# Patient Record
Sex: Male | Born: 1995 | Race: White | Hispanic: No | Marital: Single
Health system: Southern US, Community
[De-identification: ages and names within clinical notes are randomized; demographics above are authoritative.]

---

## 2017-09-19 ENCOUNTER — Other Ambulatory Visit: Payer: Self-pay

## 2017-09-19 ENCOUNTER — Emergency Department
Admission: EM | Admit: 2017-09-19 | Discharge: 2017-09-19 | Disposition: A | Discharge status: Home / Self Care | Payer: BLUE CROSS/BLUE SHIELD | Attending: Emergency Medicine | Admitting: Emergency Medicine

## 2017-09-19 ENCOUNTER — Emergency Department: Payer: BLUE CROSS/BLUE SHIELD

## 2017-09-19 ENCOUNTER — Encounter: Payer: Self-pay | Admitting: Physician Assistant

## 2017-09-19 DIAGNOSIS — W01198A Fall on same level from slipping, tripping and stumbling with subsequent striking against other object, initial encounter: Secondary | ICD-10-CM | POA: Insufficient documentation

## 2017-09-19 DIAGNOSIS — F0781 Postconcussional syndrome: Secondary | ICD-10-CM | POA: Diagnosis not present

## 2017-09-19 DIAGNOSIS — Y999 Unspecified external cause status: Secondary | ICD-10-CM | POA: Insufficient documentation

## 2017-09-19 DIAGNOSIS — Y92480 Sidewalk as the place of occurrence of the external cause: Secondary | ICD-10-CM | POA: Insufficient documentation

## 2017-09-19 DIAGNOSIS — S01112A Laceration without foreign body of left eyelid and periocular area, initial encounter: Secondary | ICD-10-CM

## 2017-09-19 DIAGNOSIS — T510X1A Toxic effect of ethanol, accidental (unintentional), initial encounter: Secondary | ICD-10-CM | POA: Diagnosis not present

## 2017-09-19 DIAGNOSIS — S098XXA Other specified injuries of head, initial encounter: Secondary | ICD-10-CM | POA: Diagnosis present

## 2017-09-19 DIAGNOSIS — F10120 Alcohol abuse with intoxication, uncomplicated: Secondary | ICD-10-CM

## 2017-09-19 DIAGNOSIS — R51 Headache: Secondary | ICD-10-CM | POA: Insufficient documentation

## 2017-09-19 DIAGNOSIS — Y939 Activity, unspecified: Secondary | ICD-10-CM | POA: Diagnosis not present

## 2017-09-19 MED ORDER — METOCLOPRAMIDE HCL 10 MG PO TABS
10.0000 mg | ORAL_TABLET | Freq: Once | ORAL | Status: AC
  Administered 2017-09-19: 10 mg via ORAL
  Filled 2017-09-19: qty 1

## 2017-09-19 MED ORDER — ACETAMINOPHEN 325 MG PO TABS
650.0000 mg | ORAL_TABLET | Freq: Once | ORAL | Status: AC
  Administered 2017-09-19: 650 mg via ORAL
  Filled 2017-09-19: qty 2

## 2017-09-19 MED ORDER — ONDANSETRON 4 MG PO TBDP: 4.0000 mg | ORAL_TABLET | Freq: Three times a day (TID) | ORAL | 0 refills | Status: AC | PRN

## 2017-09-19 NOTE — ED Notes (Signed)
Pt verbalize d/c understanding and follow up. Pt in NAD, VSS, pt ambulatory with friend at discharge. Denies any further concerns regarding this visit

## 2017-09-19 NOTE — Discharge Instructions (Signed)
Your exam and CT scan are negative at this time. Your symptoms likely represent a combination of mild concussion and alcohol hangover effect. Be sure to hydrate well and eat some complex carbohydrates. The steri-strips on the wound will fall off in about a week. Take OTC Tylenol and Motrin as needed. Take the nausea medicine as needed. [facility]-up with [facility] or [facility] as needed.

## 2017-09-19 NOTE — ED Triage Notes (Signed)
Patient states he tripped yesterday at 5pm and sustained a laceration to left eyebrow. Patient denies LOC.

## 2017-09-19 NOTE — ED Provider Notes (Addendum)
[facility] Emergency Department Provider Note ____________________________________________  Time seen: 1523  I have reviewed the triage vital signs and the nursing notes.  HISTORY  Chief Complaint  Laceration  HPI Johnathan Mcdaniel is a 22 y.o. male presents to the ED accompanied by his friend for evaluation of a facial contusion and left brow laceration.  Patient describes the accident occurred yesterday evening between 5 and 6 PM.  He was apparently walking down the street and was inebriated, when he tripped and fell, hitting his left brow on the curb.  A family witnessed the fall and brought patient into the home and cleaned them out.  There was no reported loss of consciousness, nausea, vomiting, or visual disturbance related to the accident.  Patient was then taken by his friends back to his apartment.  He admits to drinking for the better part of the week and started on Thursday night.  He denies any other episodes of syncope or vomiting.  He presents today with a mild dull headache that he describes as feeling hung over.  He denies any visual disturbance, and denies any other injury at this time.  He denies any nose injury, dental injury, or chest pain.  He admits that he did not present himself for evaluation and management of his facial laceration due to him being intoxicated.  History reviewed. No pertinent past medical history.  There are no active problems to display for this patient.  History reviewed. No pertinent surgical history.  Prior to Admission medications   Medication Sig Start Date End Date Taking? Authorizing Provider  ondansetron (ZOFRAN ODT) 4 MG disintegrating tablet Take 1 tablet (4 mg total) by mouth every 8 (eight) hours as needed. 09/19/17   Malahki Gasaway, Charlesetta Ivory, PA-C   Allergies Patient has no known allergies.  No family history on file.  Social History Social History   Tobacco Use  . Smoking status: Not on file  Substance Use  Topics  . Alcohol use: Not on file  . Drug use: Not on file    Review of Systems  Constitutional: Negative for fever. Eyes: Negative for visual changes. ENT: Negative for sore throat. Cardiovascular: Negative for chest pain. Respiratory: Negative for shortness of breath. Gastrointestinal: Negative for abdominal pain, vomiting and diarrhea. Genitourinary: Negative for dysuria. Musculoskeletal: Negative for back pain. Skin: Negative for rash. Neurological: Negative for headaches, focal weakness or numbness. ____________________________________________  PHYSICAL EXAM:  VITAL SIGNS: ED Triage Vitals [09/19/17 1450]  Enc Vitals Group     BP      Pulse      Resp      Temp      Temp src      SpO2      Weight 185 lb (83.9 kg)     Height  (1.753 m)     Head Circumference      Peak Flow      Pain Score 3     Pain Loc      Pain Edu?      Excl. in GC?     Constitutional: Alert and oriented to person, place, and time. Well appearing and in no distress.  GCS = 15 Head: Normocephalic and atraumatic.  Left brow with focal soft tissue swelling and a 2 cm laceration noted.  There is no active bleeding at this time.  Skin edges or approximated with early wound healing noted. Eyes: Conjunctivae are normal. PERRL. Normal extraocular movements and fundi. Ears: Canals clear. TMs intact  bilaterally.  Early ecchymosis noted to the upper lid. Nose: No congestion/rhinorrhea/epistaxis. Mouth/Throat: Mucous membranes are moist. Neck: Supple. No thyromegaly. Cardiovascular: Normal rate, regular rhythm. Normal distal pulses. Respiratory: Normal respiratory effort. No wheezes/rales/rhonchi. Gastrointestinal: Soft and nontender. No distention. Musculoskeletal: Nontender with normal range of motion in all extremities.  Neurologic: Nerves II through XII grossly intact.  Normal UE/LE DTRs bilaterally.  Normal finger-to-nose exam.  Negative pronator drift.  Patient with normal tandem walk on exam.   Normal gait without ataxia. Normal speech and language. No gross focal neurologic deficits are appreciated. Skin:  Skin is warm, dry and intact. No rash noted. Psychiatric: Mood and affect are normal. Patient exhibits appropriate insight and judgment. ____________________________________________  PROCEDURES  Tylenol 650 mg PO Reglan 10 mg PO  .Marland KitchenLaceration Repair Date/Time: 09/19/2017 4:27 PM Performed by: Lissa Hoard, PA-C Authorized by: Lissa Hoard, PA-C   Consent:    Consent obtained:  Verbal   Consent given by:  Patient   Risks discussed:  Poor cosmetic result   Alternatives discussed:  No treatment Anesthesia (see MAR for exact dosages):    Anesthesia method:  None Laceration details:    Location:  Face   Face location:  L eyebrow   Length (cm):  2 Repair type:    Repair type:  Simple Treatment:    Area cleansed with:  Soap and water and saline Skin repair:    Repair method:  Steri-Strips   Number of Steri-Strips:  3 Approximation:    Approximation:  Close Post-procedure details:    Dressing:  Open (no dressing)   Patient tolerance of procedure:  Tolerated well, no immediate complications  ____________________________________________  RADIOLOGY  CT Head w/o CM  IMPRESSION: No acute findings.  Minimal chronic sinus inflammatory change. ____________________________________________  INITIAL IMPRESSION / ASSESSMENT AND PLAN / ED COURSE  DDX: maxillofacial fracture, SDH, concussion, alcohol intoxication  Patient with ED evaluation of a head injury and facial laceration after a mechanical fall while intoxicated.  Patient's exam is overall benign.  No acute neuromuscular deficit and signs of a closed head injury.  Patient with mild concussion versus mild hangover symptoms on presentation.  He is reassured by his negative head CT.  Because the wound to the left brow is approximately 20 hours old at this time the wound was repaired using  Steri-Strips after local wound cleansing.  Good wound edge approximation is achieved and the patient is discharged with wound care instructions to the care of his friend.  He will be discharged with a prescription for Zofran to dose as needed for nausea and vomiting. ____________________________________________  FINAL CLINICAL IMPRESSION(S) / ED DIAGNOSES  Final diagnoses:  Eyebrow laceration, left, initial encounter  Post concussion syndrome  Hangover effect, uncomplicated (HCC)      Maretta Overdorf, Charlesetta Ivory, PA-C 09/19/17 [address], Charlesetta Ivory, PA-C 09/19/17 1645    Emily Filbert, MD 09/19/17 1654

## 2019-04-22 IMAGING — CT CT HEAD W/O CM
3 of 4 series · 15 of 47 positions shown, 18 images · non-contrast
Comparison: None.

CLINICAL DATA: Tripped and fell yesterday with laceration left
supraorbital region. Headache.

EXAM:
CT HEAD WITHOUT CONTRAST
TECHNIQUE: Contiguous axial images were obtained from the base of the skull
through the vertex without intravenous contrast.

[Series 2: head wo · axial · 0.47mm/px · z∈[-74,+46]mm · 9 of 30 slices shown, 12 images]
[im 3/30  brain]
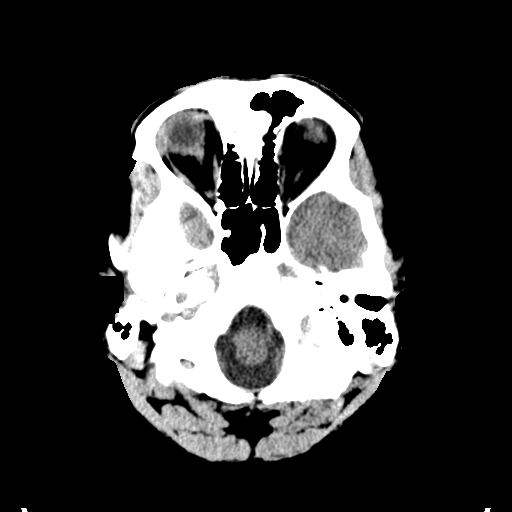
[im 3/30  bone]
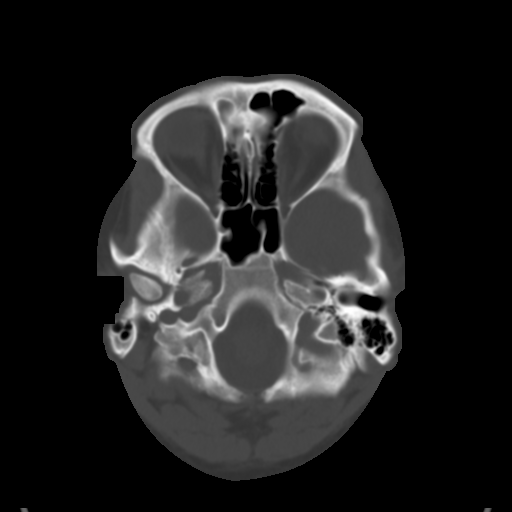
[im 6/30  brain]
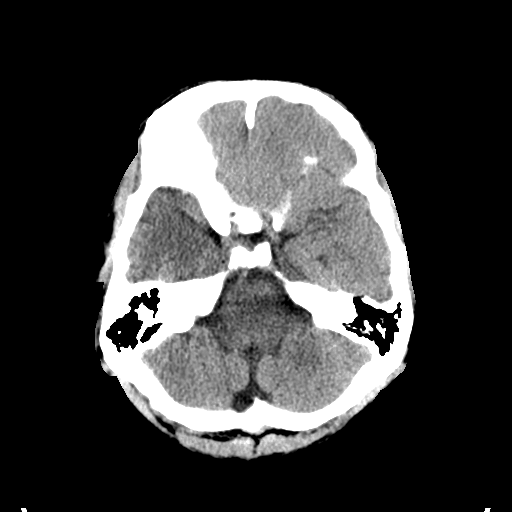
[im 9/30  brain]
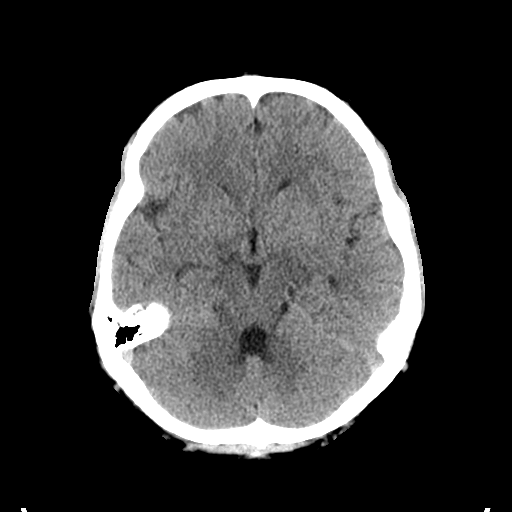
[im 12/30  brain]
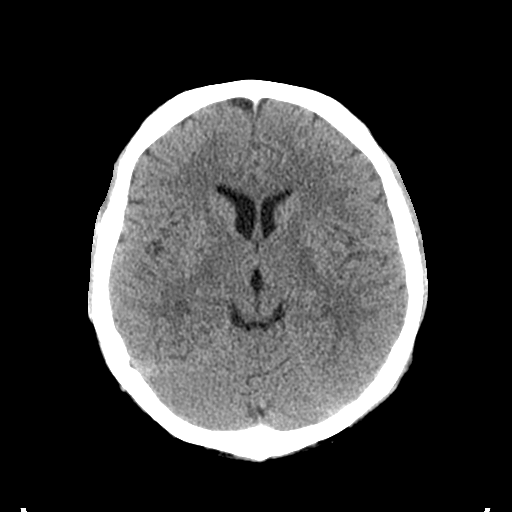
[im 16/30  brain]
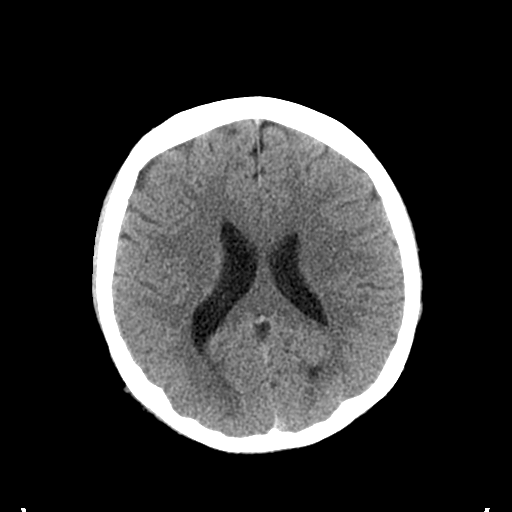
[im 16/30  bone]
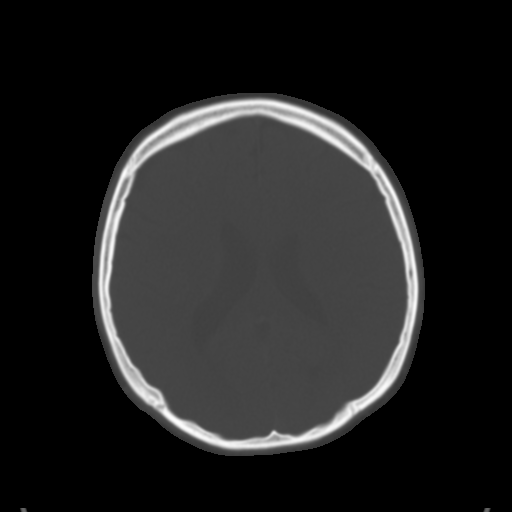
[im 18/30  brain]
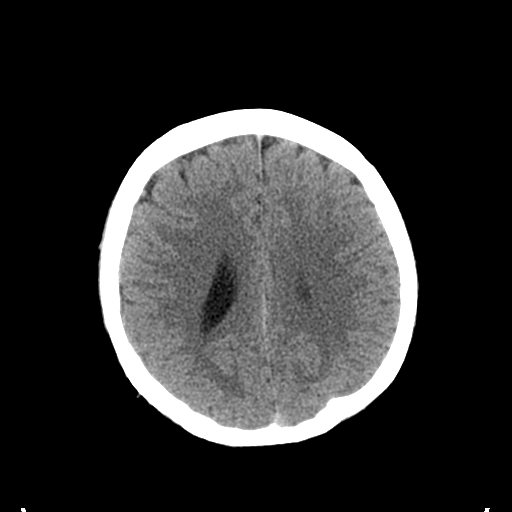
[im 21/30  brain]
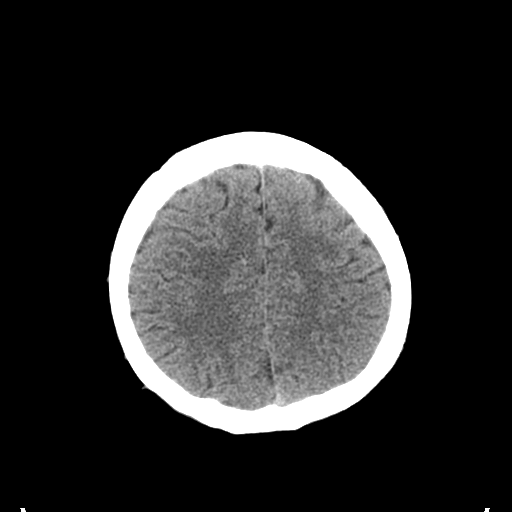
[im 25/30  brain]
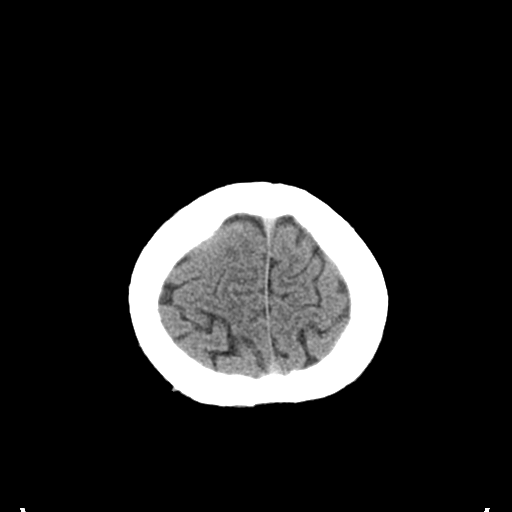
[im 27/30  brain]
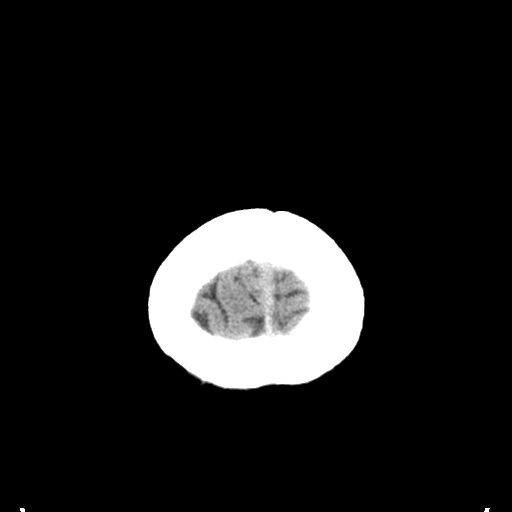
[im 27/30  bone]
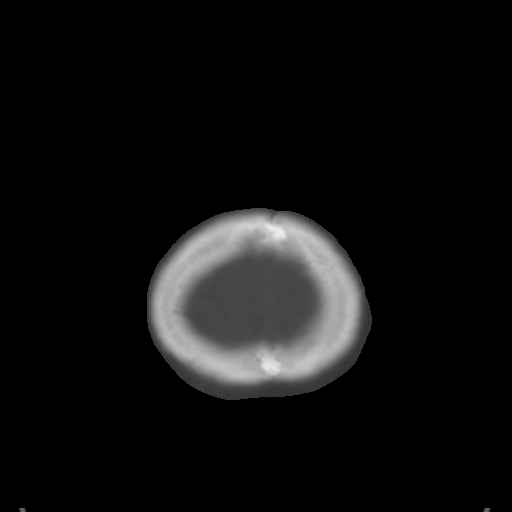

[Series 4: coronal soft tissue · coronal · 0.31mm/px · 3 of 65 slices shown]
[im 22/65  brain]
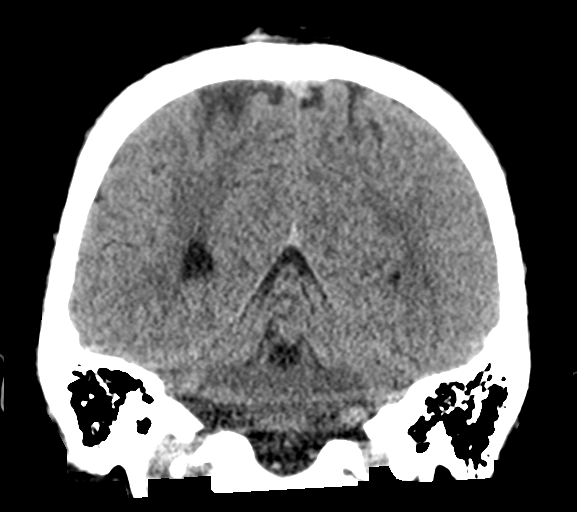
[im 29/65  brain]
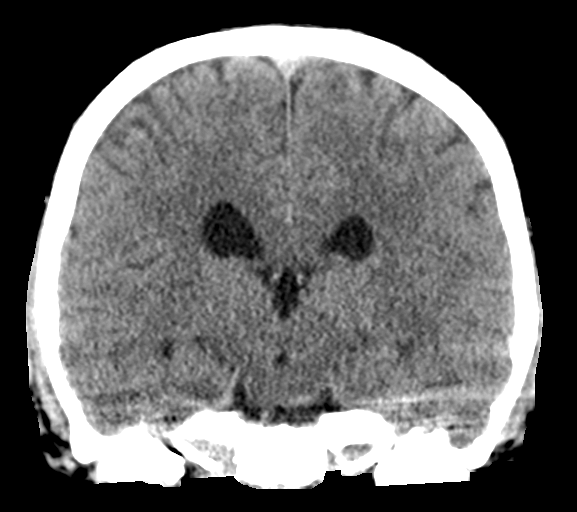
[im 36/65  brain]
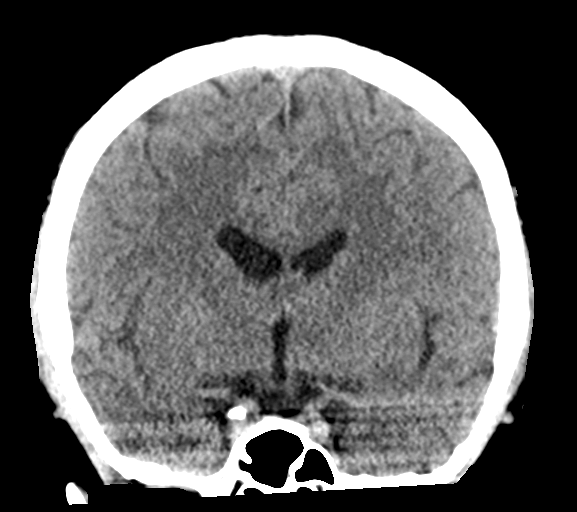

[Series 5: sagittal soft tissue · sagittal · 0.30mm/px · 3 of 59 slices shown]
[im 20/59  brain]
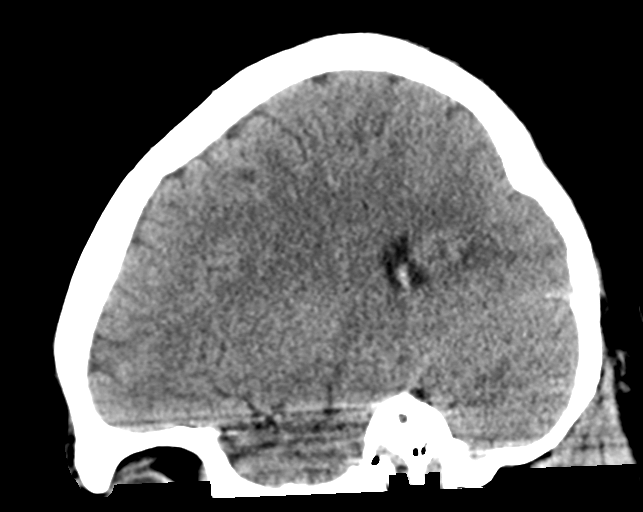
[im 30/59  brain]
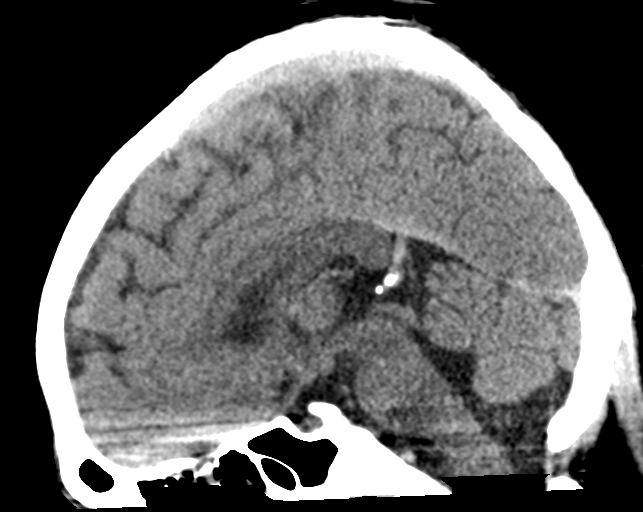
[im 39/59  brain]
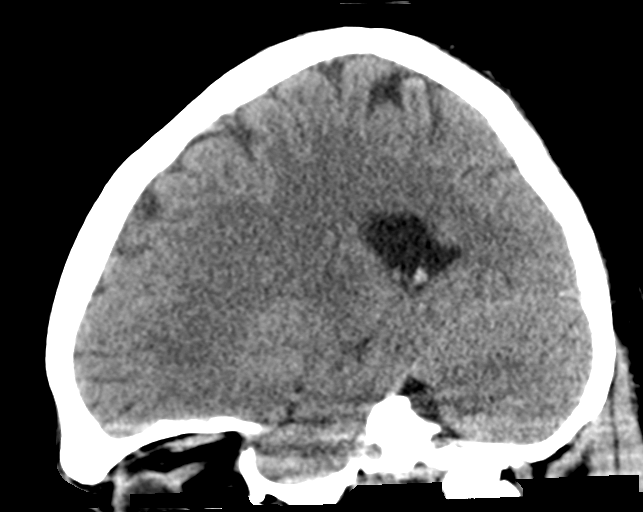

[15 of 47 positions shown; findings below may reference images not displayed]

FINDINGS: Brain: No evidence of acute infarction, hemorrhage, hydrocephalus,
extra-axial collection or mass lesion/mass effect.

Vascular: No hyperdense vessel or unexpected calcification.

Skull: Normal. Negative for fracture or focal lesion.

Sinuses/Orbits: Visualized orbits are normal. Mild opacification
over the right frontal ethmoidal recess and anterior ethmoid air
cells with minimal mucosal membrane thickening of the sphenoid
sinus. Mastoid air cells are clear.

Other: None.
IMPRESSION: No acute findings.

Minimal chronic sinus inflammatory change.
